# Patient Record
Sex: Male | Born: 1975 | Hispanic: No | Marital: Married | Smoking: Never smoker
Health system: Southern US, Community
[De-identification: ages and names within clinical notes are randomized; demographics above are authoritative.]

---

## 2015-02-12 ENCOUNTER — Emergency Department (HOSPITAL_COMMUNITY)
Admission: EM | Admit: 2015-02-12 | Discharge: 2015-02-12 | Disposition: A | Discharge status: Home / Self Care | Payer: Self-pay | Attending: Emergency Medicine | Admitting: Emergency Medicine

## 2015-02-12 ENCOUNTER — Emergency Department (HOSPITAL_COMMUNITY): Payer: Self-pay

## 2015-02-12 ENCOUNTER — Encounter (HOSPITAL_COMMUNITY): Payer: Self-pay | Admitting: Emergency Medicine

## 2015-02-12 DIAGNOSIS — R42 Dizziness and giddiness: Secondary | ICD-10-CM | POA: Insufficient documentation

## 2015-02-12 LAB — COMPREHENSIVE METABOLIC PANEL
ALBUMIN: 4.2 g/dL (ref 3.5–5.0)
ALK PHOS: 72 U/L (ref 38–126)
ALT: 44 U/L (ref 17–63)
AST: 31 U/L (ref 15–41)
Anion gap: 12 (ref 5–15)
BILIRUBIN TOTAL: 1.5 mg/dL — AB (ref 0.3–1.2)
BUN: 11 mg/dL (ref 6–20)
CHLORIDE: 105 mmol/L (ref 101–111)
CO2: 23 mmol/L (ref 22–32)
Calcium: 9 mg/dL (ref 8.9–10.3)
Creatinine, Ser: 0.8 mg/dL (ref 0.61–1.24)
GFR calc Af Amer: >60 mL/min (ref >60)
GFR calc non Af Amer: >60 mL/min (ref >60)
Glucose, Bld: 124 mg/dL — ABNORMAL HIGH (ref 65–99)
POTASSIUM: 3.6 mmol/L (ref 3.5–5.1)
Sodium: 140 mmol/L (ref 135–145)
Total Protein: 7.9 g/dL (ref 6.5–8.1)

## 2015-02-12 LAB — URINALYSIS, ROUTINE W REFLEX MICROSCOPIC
BILIRUBIN URINE: NEGATIVE
Glucose, UA: NEGATIVE mg/dL
HGB URINE DIPSTICK: NEGATIVE
KETONES UR: NEGATIVE mg/dL
LEUKOCYTES UA: NEGATIVE
Nitrite: NEGATIVE
PROTEIN: NEGATIVE mg/dL
Specific Gravity, Urine: 1.028 (ref 1.005–1.030)
Urobilinogen, UA: 0.2 mg/dL (ref 0.0–1.0)
pH: 7.5 (ref 5.0–8.0)

## 2015-02-12 LAB — CBC WITH DIFFERENTIAL/PLATELET
Basophils Absolute: 0 10*3/uL (ref 0.0–0.1)
Basophils Relative: 0 % (ref 0–1)
Eosinophils Absolute: 0 10*3/uL (ref 0.0–0.7)
Eosinophils Relative: 0 % (ref 0–5)
HCT: 43.3 % (ref 39.0–52.0)
HEMOGLOBIN: 14.9 g/dL (ref 13.0–17.0)
LYMPHS PCT: 11 % — AB (ref 12–46)
Lymphs Abs: 1.6 10*3/uL (ref 0.7–4.0)
MCH: 30.2 pg (ref 26.0–34.0)
MCHC: 34.4 g/dL (ref 30.0–36.0)
MCV: 87.7 fL (ref 78.0–100.0)
MONOS PCT: 5 % (ref 3–12)
Monocytes Absolute: 0.6 10*3/uL (ref 0.1–1.0)
NEUTROS ABS: 11.6 10*3/uL — AB (ref 1.7–7.7)
NEUTROS PCT: 84 % — AB (ref 43–77)
Platelets: 230 10*3/uL (ref 150–400)
RBC: 4.94 MIL/uL (ref 4.22–5.81)
RDW: 12.8 % (ref 11.5–15.5)
WBC: 13.9 10*3/uL — AB (ref 4.0–10.5)

## 2015-02-12 LAB — LIPASE, BLOOD: LIPASE: 18 U/L — AB (ref 22–51)

## 2015-02-12 MED ORDER — SODIUM CHLORIDE 0.9 % IV BOLUS (SEPSIS)
1000.0000 mL | Freq: Once | INTRAVENOUS | Status: AC
  Administered 2015-02-12: 1000 mL via INTRAVENOUS

## 2015-02-12 MED ORDER — MECLIZINE HCL 25 MG PO TABS: ORAL_TABLET | ORAL | Status: AC

## 2015-02-12 MED ORDER — ONDANSETRON 4 MG PO TBDP: 4.0000 mg | ORAL_TABLET | Freq: Three times a day (TID) | ORAL | Status: AC | PRN

## 2015-02-12 MED ORDER — ONDANSETRON HCL 4 MG/2ML IJ SOLN
4.0000 mg | Freq: Once | INTRAMUSCULAR | Status: AC
  Administered 2015-02-12: 4 mg via INTRAVENOUS
  Filled 2015-02-12: qty 2

## 2015-02-12 MED ORDER — MECLIZINE HCL 25 MG PO TABS
25.0000 mg | ORAL_TABLET | Freq: Once | ORAL | Status: AC
  Administered 2015-02-12: 25 mg via ORAL
  Filled 2015-02-12: qty 1

## 2015-02-12 MED ORDER — DIPHENHYDRAMINE HCL 50 MG/ML IJ SOLN
25.0000 mg | Freq: Once | INTRAMUSCULAR | Status: AC
  Administered 2015-02-12: 25 mg via INTRAVENOUS
  Filled 2015-02-12: qty 1

## 2015-02-12 NOTE — Discharge Instructions (Signed)
Medications until 24 hours without dizziness. No driving until 24 hours without dizziness. Vertigo will go away on its own, this can take several days.  Benign Positional Vertigo Vertigo means you feel like you or your surroundings are moving when they are not. Benign positional vertigo is the most common form of vertigo. Benign means that the cause of your condition is not serious. Benign positional vertigo is more common in older adults. CAUSES  Benign positional vertigo is the result of an upset in the labyrinth system. This is an area in the middle ear that helps control your balance. This may be caused by a viral infection, head injury, or repetitive motion. However, often no specific cause is found. SYMPTOMS  Symptoms of benign positional vertigo occur when you move your head or eyes in different directions. Some of the symptoms may include:  Loss of balance and falls.  Vomiting.  Blurred vision.  Dizziness.  Nausea.  Involuntary eye movements (nystagmus). DIAGNOSIS  Benign positional vertigo is usually diagnosed by physical exam. If the specific cause of your benign positional vertigo is unknown, your caregiver may perform imaging tests, such as magnetic resonance imaging (MRI) or computed tomography (CT). TREATMENT  Your caregiver may recommend movements or procedures to correct the benign positional vertigo. Medicines such as meclizine, benzodiazepines, and medicines for nausea may be used to treat your symptoms. In rare cases, if your symptoms are caused by certain conditions that affect the inner ear, you may need surgery. HOME CARE INSTRUCTIONS   Follow your caregiver's instructions.  Move slowly. Do not make sudden body or head movements.  Avoid driving.  Avoid operating heavy machinery.  Avoid performing any tasks that would be dangerous to you or others during a vertigo episode.  Drink enough fluids to keep your urine clear or pale yellow. SEEK IMMEDIATE MEDICAL  CARE IF:   You develop problems with walking, weakness, numbness, or using your arms, hands, or legs.  You have difficulty speaking.  You develop severe headaches.  Your nausea or vomiting continues or gets worse.  You develop visual changes.  Your family or friends notice any behavioral changes.  Your condition gets worse.  You have a fever.  You develop a stiff neck or sensitivity to light. MAKE SURE YOU:   Understand these instructions.  Will watch your condition.  Will get help right away if you are not doing well or get worse. Document Released: 05/16/2006 Document Revised: 10/31/2011 Document Reviewed: 04/28/2011 [facility] Patient Information 2015 [place], [place]. This information is not intended to replace advice given to you by your health care provider. Make sure you discuss any questions you have with your health care provider.

## 2015-02-12 NOTE — ED Notes (Signed)
Per EMS: Pt c/o weakness and NV that started when he was cutting hair today.

## 2015-02-12 NOTE — ED Notes (Signed)
Pt ambulated out of room and down hallway approximately 100 ft without prior symptoms; pt stated he felt fine

## 2015-02-12 NOTE — ED Provider Notes (Addendum)
CSN: [id number]     Arrival date & time 02/12/15  1711 History   First MD Initiated Contact with Patient 02/12/15 2021     Chief Complaint  Patient presents with  . Weakness      HPI  She presents for evaluation of dizziness. He states that he had some symptoms yesterday while working at his job at a [employer]. He felt dizzy after turning to go to the bathroom to urinate. He states he started to stand the next day;, but had to hold onto something in the bathroom. Again this morning, had an episode  of dizziness that has now improved, but with position change, has recurred twice during the day.  History reviewed. No pertinent past medical history. No past surgical history on file. No family history on file. History  Substance Use Topics  . Smoking status: Never Smoker   . Smokeless tobacco: Not on file  . Alcohol Use: No    Review of Systems  Constitutional: Negative for fever, chills, diaphoresis, appetite change and fatigue.  HENT: Negative for mouth sores, sore throat and trouble swallowing.   Eyes: Negative for visual disturbance.  Respiratory: Negative for cough, chest tightness, shortness of breath and wheezing.   Cardiovascular: Negative for chest pain.  Gastrointestinal: Negative for nausea, vomiting, abdominal pain, diarrhea and abdominal distention.  Endocrine: Negative for polydipsia, polyphagia and polyuria.  Genitourinary: Negative for dysuria, frequency and hematuria.  Musculoskeletal: Negative for gait problem.  Skin: Negative for color change, pallor and rash.  Neurological: Positive for dizziness. Negative for syncope, light-headedness and headaches.  Hematological: Does not bruise/bleed easily.  Psychiatric/Behavioral: Negative for behavioral problems and confusion.      Allergies  Review of patient's allergies indicates no known allergies.  Home Medications   Prior to Admission medications   Medication Sig Start Date End Date Taking? Authorizing  Provider  AMPICILLIN PO Take 1 capsule by mouth once.   Yes Historical Provider, MD  ibuprofen (ADVIL,MOTRIN) 200 MG tablet Take 400 mg by mouth every 6 (six) hours as needed for headache (headache and dizzines).   Yes Historical Provider, MD   BP 112/53 mmHg  Pulse 78  Temp(Src) 97.8 F (36.6 C) (Oral)  Resp 18  SpO2 98% Physical Exam  Constitutional: He is oriented to person, place, and time. He appears well-developed and well-nourished. No distress.  HENT:  Head: Normocephalic.  Eyes: Conjunctivae are normal. Pupils are equal, round, and reactive to light. No scleral icterus.  Neck: Normal range of motion. Neck supple. No thyromegaly present.  Cardiovascular: Normal rate and regular rhythm.  Exam reveals no gallop and no friction rub.   No murmur heard. Pulmonary/Chest: Effort normal and breath sounds normal. No respiratory distress. He has no wheezes. He has no rales.  Abdominal: Soft. Bowel sounds are normal. He exhibits no distension. There is no tenderness. There is no rebound.  Musculoskeletal: Normal range of motion.  Neurological: He is alert and oriented to person, place, and time.  Skin: Skin is warm and dry. No rash noted.  Psychiatric: He has a normal mood and affect. His behavior is normal.    ED Course  Procedures (including critical care time) Labs Review Labs Reviewed  CBC WITH DIFFERENTIAL/PLATELET - Abnormal; Notable for the following:    WBC 13.9 (*)    Neutrophils Relative % 84 (*)    Neutro Abs 11.6 (*)    Lymphocytes Relative 11 (*)    All other components within normal limits  COMPREHENSIVE METABOLIC  PANEL - Abnormal; Notable for the following:    Glucose, Bld 124 (*)    Total Bilirubin 1.5 (*)    All other components within normal limits  LIPASE, BLOOD - Abnormal; Notable for the following:    Lipase 18 (*)    All other components within normal limits  URINALYSIS, ROUTINE W REFLEX MICROSCOPIC (NOT AT [facility])    Imaging Review Ct Head Wo  Contrast  02/12/2015   CLINICAL DATA:  Weakness, nausea and vomiting beginning today.  EXAM: CT HEAD WITHOUT CONTRAST  TECHNIQUE: Contiguous axial images were obtained from the base of the skull through the vertex without intravenous contrast.  COMPARISON:  None.  FINDINGS: The brain appears normal without hemorrhage, infarct, mass lesion, mass effect, midline shift or abnormal extra-axial fluid collection. No hydrocephalus or pneumocephalus. The calvarium is intact. Imaged paranasal sinuses and mastoid air cells are clear appear  IMPRESSION: Normal head CT.   Electronically Signed   By: Drusilla Kanner M.D.   On: 02/12/2015 20:51     EKG Interpretation None      MDM   Final diagnoses:  Vertigo    Reassuring studies. Patient able to move without additional symptoms. Discharged home. Meclizine until 24 hours of symptoms. No driving until 24 symptoms.    Rolland Porter, MD 02/17/15 2725  Rolland Porter, MD 02/20/15 1351

## 2015-02-12 NOTE — ED Notes (Signed)
Patient has been given cup to give urine sample.

## 2017-01-05 IMAGING — CT CT HEAD W/O CM
2 series · 16 of 30 positions shown, 20 images · non-contrast
Comparison: None.

CLINICAL DATA: Weakness, nausea and vomiting beginning today.

EXAM:
CT HEAD WITHOUT CONTRAST
TECHNIQUE: Contiguous axial images were obtained from the base of the skull
through the vertex without intravenous contrast.

[Series 2: head w/o · axial · non-contrast · 0.41mm/px · z∈[-69,+76]mm · 13 of 35 slices shown, 17 images]
[im 3/35  brain]
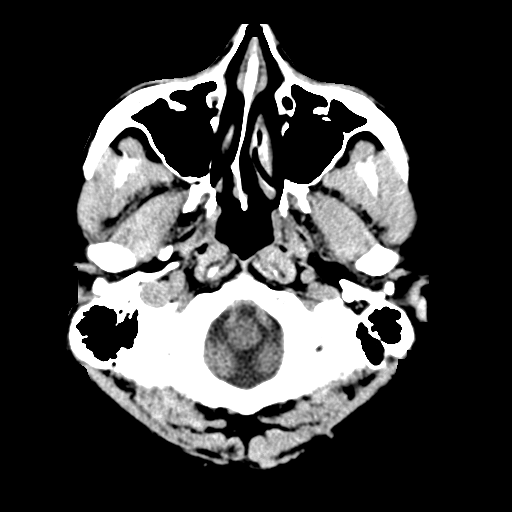
[im 3/35  bone]
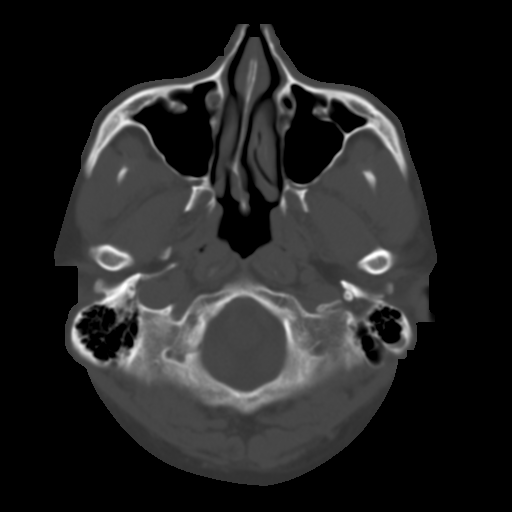
[im 5/35  brain]
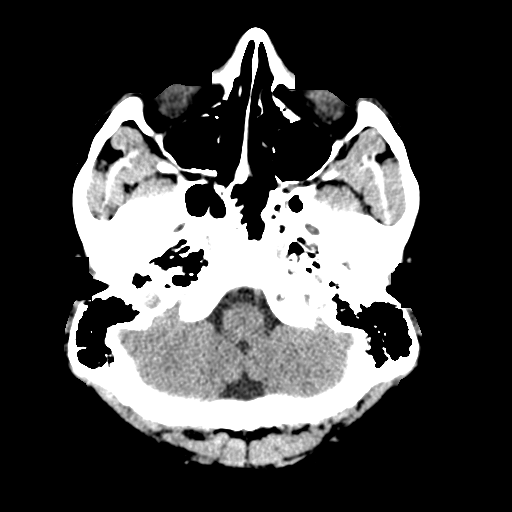
[im 8/35  brain]
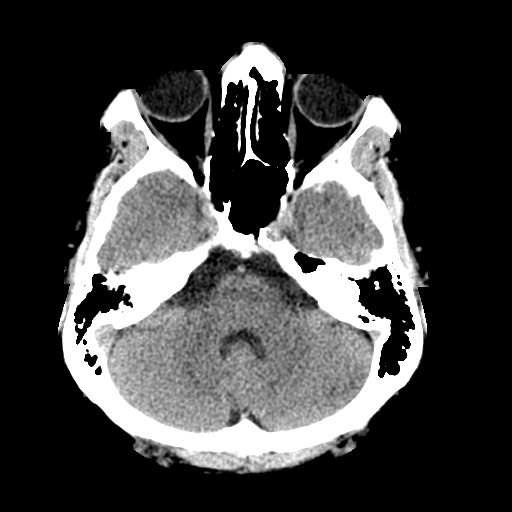
[im 10/35  brain]
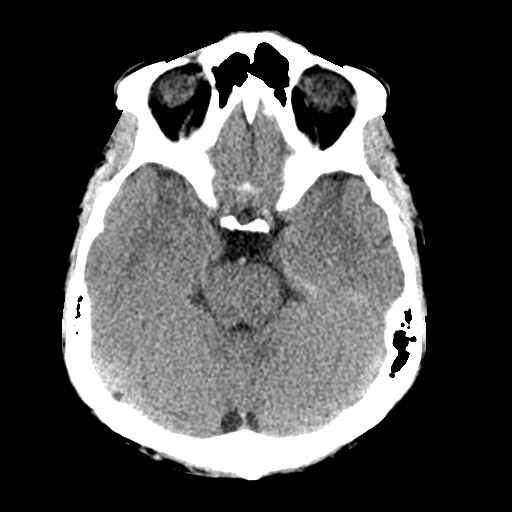
[im 13/35  brain]
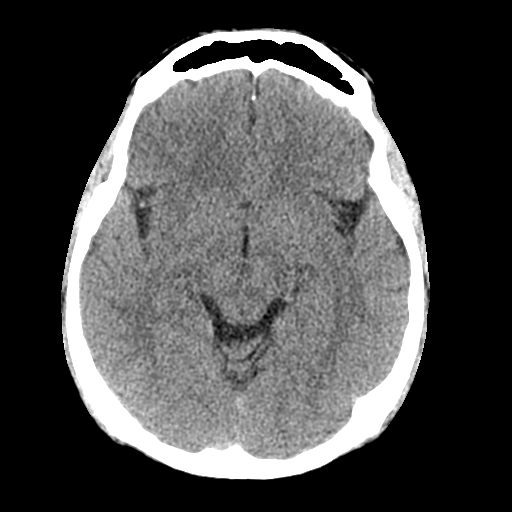
[im 13/35  bone]
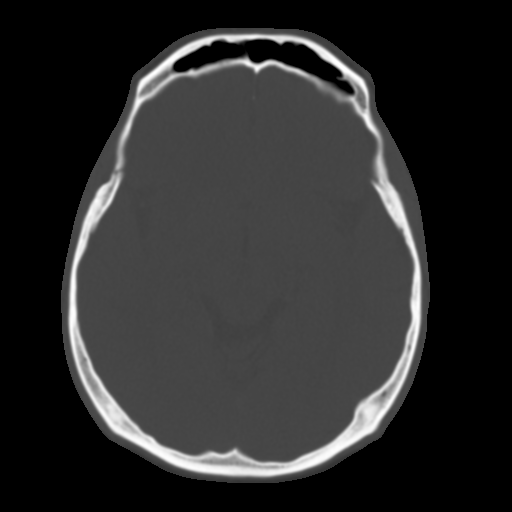
[im 15/35  brain]
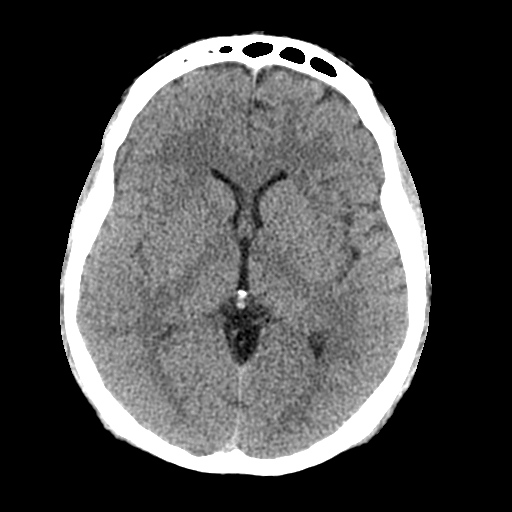
[im 18/35  brain]
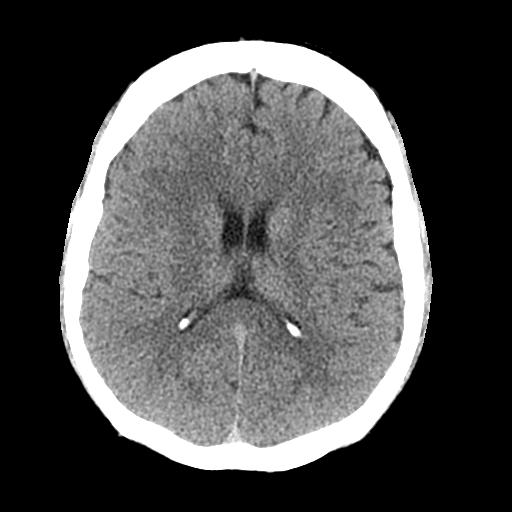
[im 20/35  brain]
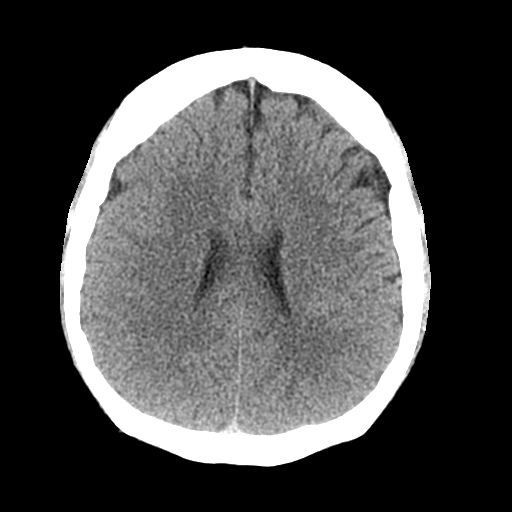
[im 22/35  brain]
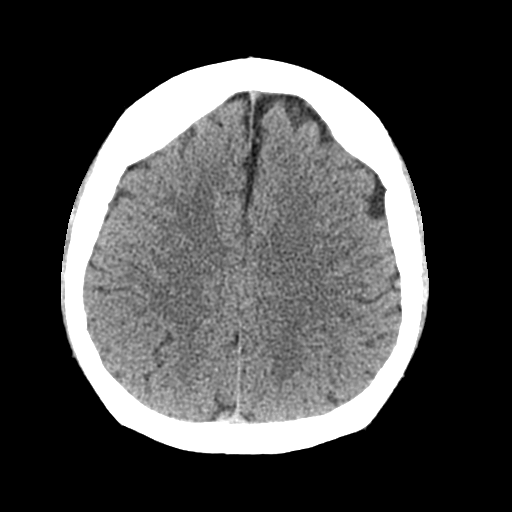
[im 22/35  bone]
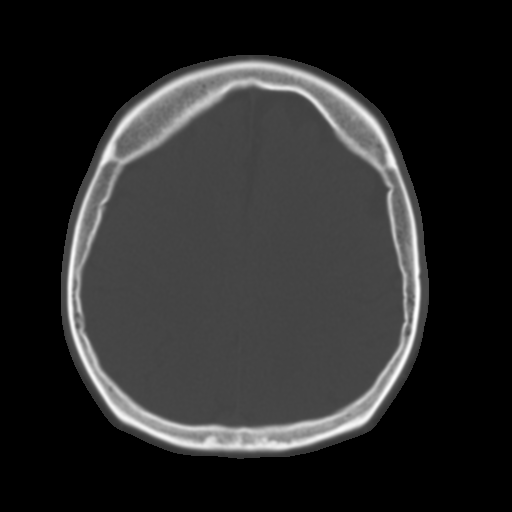
[im 25/35  brain]
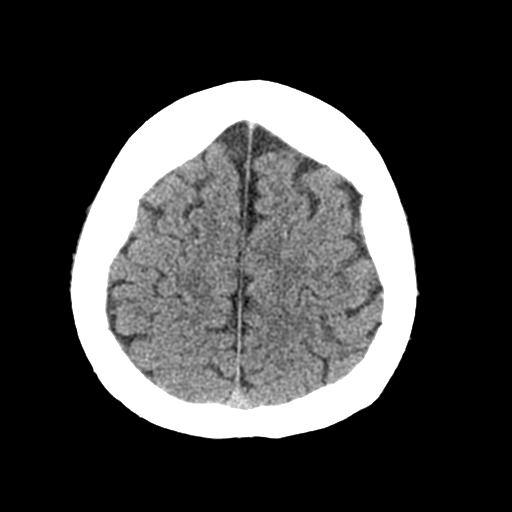
[im 27/35  brain]
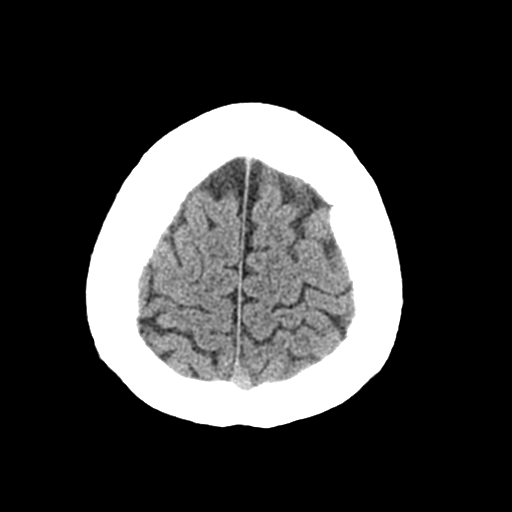
[im 30/35  brain]
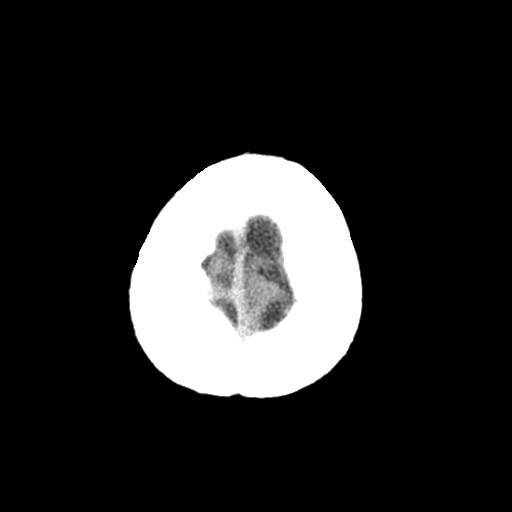
[im 32/35  brain]
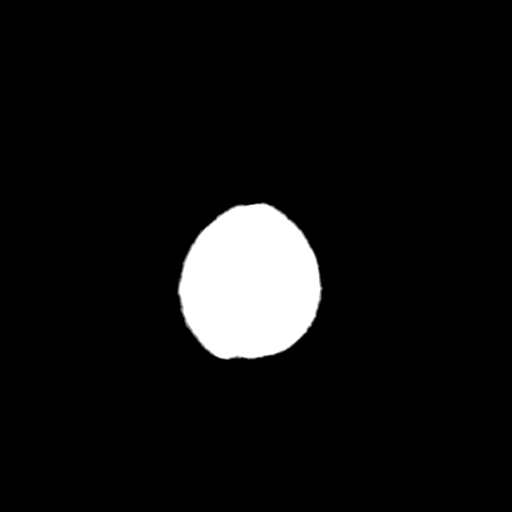
[im 32/35  bone]
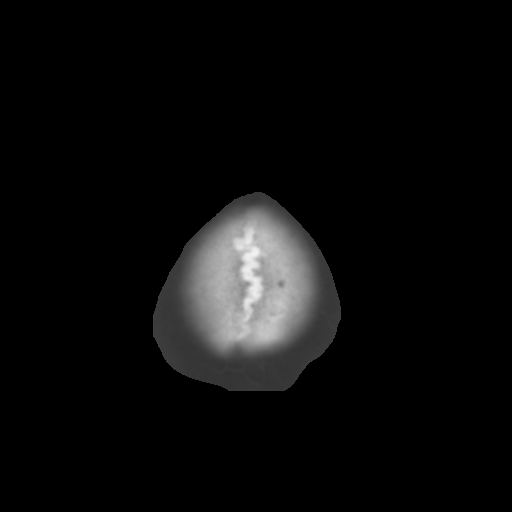

[Series 3: bone windows · axial · 0.41mm/px · z∈[-69,-19]mm · 3 of 35 slices shown]
[im 3/35  bone]
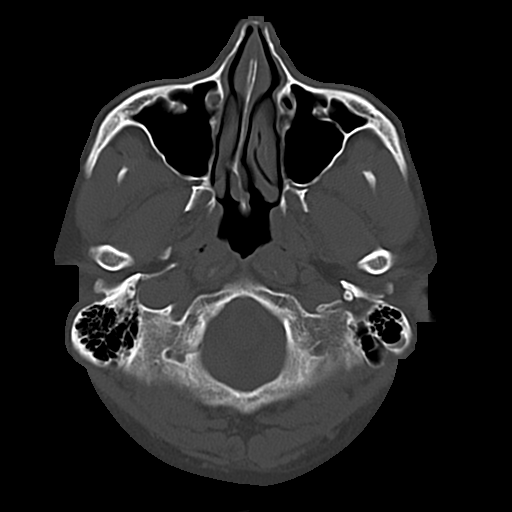
[im 8/35  bone]
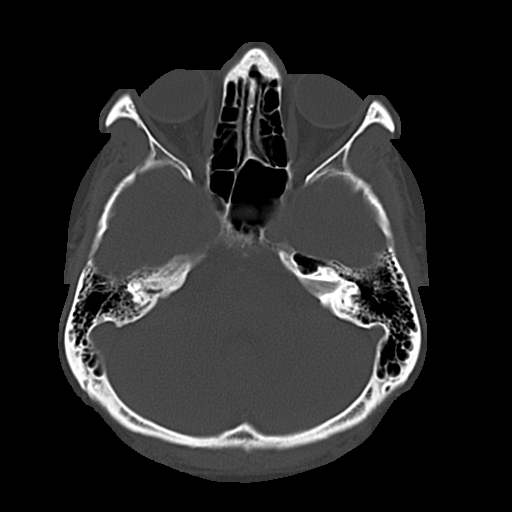
[im 13/35  bone]
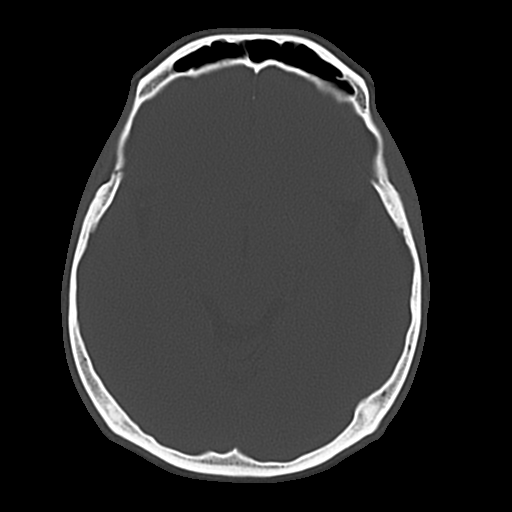

[16 of 30 positions shown; findings below may reference images not displayed]

FINDINGS: The brain appears normal without hemorrhage, infarct, mass lesion,
mass effect, midline shift or abnormal extra-axial fluid collection.
No hydrocephalus or pneumocephalus. The calvarium is intact. Imaged
paranasal sinuses and mastoid air cells are clear appear
IMPRESSION: Normal head CT.

## 2019-08-28 ENCOUNTER — Other Ambulatory Visit: Payer: Self-pay

## 2019-08-28 DIAGNOSIS — Z20822 Contact with and (suspected) exposure to covid-19: Secondary | ICD-10-CM

## 2019-08-30 LAB — NOVEL CORONAVIRUS, NAA: SARS-CoV-2, NAA: NOT DETECTED

## 2020-03-02 ENCOUNTER — Ambulatory Visit: Payer: HRSA Program | Attending: Internal Medicine

## 2020-03-02 DIAGNOSIS — Z20822 Contact with and (suspected) exposure to covid-19: Secondary | ICD-10-CM

## 2020-03-03 LAB — NOVEL CORONAVIRUS, NAA: SARS-CoV-2, NAA: NOT DETECTED

## 2020-03-03 LAB — SARS-COV-2, NAA 2 DAY TAT
